# Patient Record
Sex: Male | Born: 1965 | Race: White | Hispanic: No | Marital: Married | Smoking: Never smoker
Health system: Southern US, Community
[De-identification: ages and names within clinical notes are randomized; demographics above are authoritative.]

## PROBLEM LIST (undated history)

## (undated) HISTORY — PX: BRAIN SURGERY: SHX531

---

## 2017-01-15 ENCOUNTER — Emergency Department (HOSPITAL_BASED_OUTPATIENT_CLINIC_OR_DEPARTMENT_OTHER)
Admission: EM | Admit: 2017-01-15 | Discharge: 2017-01-16 | Disposition: A | Discharge status: Home / Self Care | Payer: BLUE CROSS/BLUE SHIELD | Attending: Emergency Medicine | Admitting: Emergency Medicine

## 2017-01-15 ENCOUNTER — Emergency Department (HOSPITAL_BASED_OUTPATIENT_CLINIC_OR_DEPARTMENT_OTHER): Payer: BLUE CROSS/BLUE SHIELD

## 2017-01-15 ENCOUNTER — Encounter (HOSPITAL_BASED_OUTPATIENT_CLINIC_OR_DEPARTMENT_OTHER): Payer: Self-pay | Admitting: *Deleted

## 2017-01-15 DIAGNOSIS — Y999 Unspecified external cause status: Secondary | ICD-10-CM | POA: Diagnosis not present

## 2017-01-15 DIAGNOSIS — Y929 Unspecified place or not applicable: Secondary | ICD-10-CM | POA: Insufficient documentation

## 2017-01-15 DIAGNOSIS — S59902A Unspecified injury of left elbow, initial encounter: Secondary | ICD-10-CM | POA: Diagnosis present

## 2017-01-15 DIAGNOSIS — S8002XA Contusion of left knee, initial encounter: Secondary | ICD-10-CM | POA: Diagnosis not present

## 2017-01-15 DIAGNOSIS — S51012A Laceration without foreign body of left elbow, initial encounter: Secondary | ICD-10-CM | POA: Insufficient documentation

## 2017-01-15 DIAGNOSIS — S63501A Unspecified sprain of right wrist, initial encounter: Secondary | ICD-10-CM

## 2017-01-15 DIAGNOSIS — Y9389 Activity, other specified: Secondary | ICD-10-CM | POA: Diagnosis not present

## 2017-01-15 DIAGNOSIS — S0121XA Laceration without foreign body of nose, initial encounter: Secondary | ICD-10-CM | POA: Diagnosis not present

## 2017-01-15 DIAGNOSIS — S6391XA Sprain of unspecified part of right wrist and hand, initial encounter: Secondary | ICD-10-CM | POA: Diagnosis not present

## 2017-01-15 DIAGNOSIS — S0181XA Laceration without foreign body of other part of head, initial encounter: Secondary | ICD-10-CM

## 2017-01-15 DIAGNOSIS — Z23 Encounter for immunization: Secondary | ICD-10-CM | POA: Diagnosis not present

## 2017-01-15 MED ORDER — LIDOCAINE HCL 2 % IJ SOLN
20.0000 mL | Freq: Once | INTRAMUSCULAR | Status: AC
  Administered 2017-01-15: 400 mg
  Filled 2017-01-15: qty 20

## 2017-01-15 MED ORDER — TETANUS-DIPHTH-ACELL PERTUSSIS 5-2.5-18.5 LF-MCG/0.5 IM SUSP
0.5000 mL | Freq: Once | INTRAMUSCULAR | Status: AC
  Administered 2017-01-15: 0.5 mL via INTRAMUSCULAR
  Filled 2017-01-15: qty 0.5

## 2017-01-15 NOTE — ED Provider Notes (Signed)
[facility] Provider Note   CSN: [id number] Arrival date & time: 01/15/17  2231  By signing my name below, I, Diona Browner, attest that this documentation has been prepared under the direction and in the presence of Kailon Treese, Canary Brim, MD. Electronically Signed: Diona Browner, ED Scribe. 01/15/17. 10:56 PM.  History   Chief Complaint Chief Complaint  Patient presents with  . Fall    HPI Carlos Callahan is a 51 y.o. male who presents to the Emergency Department complaining of multiple lacerations s/p falling off of a horse ~ 2 hours ago. Associates sx include wrist pain, left knee pain, left shoulder pain and wounds to his left elbow and nose. Tetanus UTD. Pt denies LOC, HA, and head injury.  The history is provided by the patient. No language interpreter was used.    History reviewed. No pertinent past medical history.  There are no active problems to display for this patient.   Past Surgical History:  Procedure Laterality Date  . BRAIN SURGERY         Home Medications    Prior to Admission medications   Medication Sig Start Date End Date Taking? Authorizing Provider  oxyCODONE-acetaminophen (PERCOCET) 5-325 MG tablet Take 1-2 tablets by mouth every 4 (four) hours as needed. 01/16/17   Gilda Crease, MD    Family History No family history on file.  Social History Social History  Substance Use Topics  . Smoking status: Never Smoker  . Smokeless tobacco: Not on file  . Alcohol use 3.6 oz/week    6 Cans of beer per week     Allergies   Patient has no known allergies.   Review of Systems Review of Systems  Musculoskeletal: Positive for arthralgias, joint swelling and myalgias.  Skin: Positive for wound.  Neurological: Negative for syncope and headaches.  All other systems reviewed and are negative.    Physical Exam Updated Vital Signs BP (!) 162/99   Pulse 80   Temp 98.4 F (36.9 C) (Oral)   Resp 16   Ht 6\' 1"  (1.854 m)    Wt 102.1 kg (225 lb)   SpO2 100%   BMI 29.69 kg/m   Physical Exam  Constitutional: He is oriented to person, place, and time. He appears well-developed and well-nourished. No distress.  HENT:  Head: Normocephalic and atraumatic.  Right Ear: Hearing normal.  Left Ear: Hearing normal.  Nose: Nose normal.  Mouth/Throat: Oropharynx is clear and moist and mucous membranes are normal.  Eyes: Conjunctivae and EOM are normal. Pupils are equal, round, and reactive to light.  Neck: Normal range of motion. Neck supple.  Cardiovascular: Regular rhythm, S1 normal and S2 normal.  Exam reveals no gallop and no friction rub.   No murmur heard. Pulmonary/Chest: Effort normal and breath sounds normal. No respiratory distress. He exhibits no tenderness.  Abdominal: Soft. Normal appearance and bowel sounds are normal. There is no hepatosplenomegaly. There is no tenderness. There is no rebound, no guarding, no tenderness at McBurney's point and negative Murphy's sign. No hernia.  Musculoskeletal: Normal range of motion.  Swelling and abrasion to the bola of the right wrist. Bruising at the lower aspect of right thumb. Left knee anterior swelling. Normal ROM. Mild abrasion of right forehead. 1.5 cm laceration over the nose. No septal hematoma of the nose. abrasion and 2 cm laceration to left elbow.  Neurological: He is alert and oriented to person, place, and time. He has normal strength. No cranial nerve deficit or sensory  deficit. Coordination normal. GCS eye subscore is 4. GCS verbal subscore is 5. GCS motor subscore is 6.  Skin: Skin is warm, dry and intact. No rash noted. No cyanosis.  Psychiatric: He has a normal mood and affect. His speech is normal and behavior is normal. Thought content normal.  Nursing note and vitals reviewed.    ED Treatments / Results  DIAGNOSTIC STUDIES: Oxygen Saturation is 100% on RA, normal by my interpretation.   COORDINATION OF CARE: 10:56 PM-Discussed next steps  with pt. Pt verbalized understanding and is agreeable with the plan.   Labs (all labs ordered are listed, but only abnormal results are displayed) Labs Reviewed - No data to display  EKG  EKG Interpretation None       Radiology Dg Nasal Bones  Result Date: 01/16/2017 CLINICAL DATA:  Patient fell from horse. Laceration to the mid nose. EXAM: NASAL BONES - 3+ VIEW COMPARISON:  None. FINDINGS: There is no evidence of fracture or other bone abnormality. IMPRESSION: Negative. Electronically Signed   By: Tollie Eth M.D.   On: 01/16/2017 00:12   Dg Elbow Complete Left  Result Date: 01/16/2017 CLINICAL DATA:  Larey Seat from horse today. Open wound of the posterior left elbow. EXAM: LEFT ELBOW - COMPLETE 3+ VIEW COMPARISON:  None. FINDINGS: There is no evidence of fracture, dislocation, or joint effusion. Soft tissue laceration posterior to the olecranon with subcutaneous air and soft tissue debris noted. IMPRESSION: Negative for acute elbow fracture or dislocation. No joint effusion. Soft tissue laceration overlying the olecranon with soft tissue debris noted. Electronically Signed   By: Tollie Eth M.D.   On: 01/16/2017 00:05   Dg Wrist Complete Right  Result Date: 01/16/2017 CLINICAL DATA:  Right wrist pain after fall from horse. EXAM: RIGHT WRIST - COMPLETE 3+ VIEW COMPARISON:  None. FINDINGS: There is no evidence of fracture or dislocation. Slight joint space narrowing at the base of the thumb metacarpal with spurring. Soft tissue swelling is noted about the wrist. IMPRESSION: First CMC joint osteoarthritis. No acute osseous abnormality. Soft tissue swelling about the wrist. Electronically Signed   By: Tollie Eth M.D.   On: 01/16/2017 00:11   Dg Knee Complete 4 Views Left  Result Date: 01/16/2017 CLINICAL DATA:  Patient fell from horse EXAM: LEFT KNEE - COMPLETE 4+ VIEW COMPARISON:  None. FINDINGS: No evidence of fracture, dislocation, or joint effusion. Stigmata of old MCL injury with  curvilinear soft tissue calcification adjacent to the left medial femoral condyle. No evidence of arthropathy or other focal bone abnormality. Soft tissues are unremarkable. IMPRESSION: 1. 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 2. No acute osseous abnormality. Electronically Signed   By: Tollie Eth M.D.   On: 01/16/2017 00:09    Procedures .Marland KitchenLaceration Repair Date/Time: 01/16/2017 12:54 AM Performed by: Gilda Crease Authorized by: Gilda Crease   Consent:    Consent obtained:  Verbal   Consent given by:  Patient   Risks discussed:  Infection and retained foreign body Universal protocol:    Procedure explained and questions answered to patient or proxy's satisfaction: yes     Relevant documents present and verified: yes     Test results available and properly labeled: yes     Imaging studies available: yes     Required blood products, implants, devices, and special equipment available: yes     Site/side marked: yes     Immediately prior to  procedure, a time out was called: yes     Patient identity confirmed:  Verbally with patient Anesthesia (see MAR for exact dosages):    Anesthesia method:  Local infiltration   Local anesthetic:  Lidocaine 2% w/o epi Laceration details:    Location:  Shoulder/arm   Shoulder/arm location:  L elbow   Length (cm):  2.5   Depth (mm):  5 Repair type:    Repair type:  Simple Pre-procedure details:    Preparation:  Patient was prepped and draped in usual sterile fashion Exploration:    Wound exploration: wound explored through full range of motion     Contaminated: yes (slightly, small sand like debris)   Treatment:    Area cleansed with:  Betadine   Amount of cleaning:  Extensive   Irrigation solution:  Sterile saline   Irrigation volume:  1000   Irrigation method:  Syringe   Visualized foreign bodies/material removed:  yes   Skin repair:    Repair method:  Sutures   Suture size:  4-0   Suture material:  Nylon   Suture technique:  Simple interrupted   Number of sutures:  3 Approximation:    Approximation:  Close   Vermilion border: well-aligned   Post-procedure details:    Patient tolerance of procedure:  Tolerated well, no immediate complications .Marland Kitchen.Laceration Repair Date/Time: 01/16/2017 12:56 AM Performed by: Gilda CreasePOLLINA, Carlos Zanetti J Authorized by: Gilda CreasePOLLINA, Sondi Desch J   Consent:    Consent obtained:  Verbal   Consent given by:  Patient   Risks discussed:  Infection and poor cosmetic result Universal protocol:    Procedure explained and questions answered to patient or proxy's satisfaction: yes     Relevant documents present and verified: yes     Test results available and properly labeled: yes     Imaging studies available: yes     Required blood products, implants, devices, and special equipment available: yes     Site/side marked: yes     Immediately prior to procedure, a time out was called: yes     Patient identity confirmed:  Verbally with patient Anesthesia (see MAR for exact dosages):    Anesthesia method:  None Laceration details:    Location:  Face   Face location:  Nose   Length (cm):  1.5 Repair type:    Repair type:  Simple Treatment:    Area cleansed with:  Betadine   Amount of cleaning:  Standard   Irrigation solution:  Sterile saline   Irrigation method:  Syringe Skin repair:    Repair method:  Tissue adhesive Approximation:    Approximation:  Close   Vermilion border: well-aligned   Post-procedure details:    Patient tolerance of procedure:  Tolerated well, no immediate complications   (including critical care time)  Medications Ordered in ED Medications  lidocaine (XYLOCAINE) 2 % (with pres) injection 400 mg (400 mg Infiltration Given 01/15/17 2309)  Tdap (BOOSTRIX) injection 0.5 mL (0.5 mLs Intramuscular Given 01/15/17 2307)     Initial Impression /  Assessment and Plan / ED Course  I have reviewed the triage vital signs and the nursing notes.  Pertinent labs & imaging results that were available during my care of the patient were reviewed by me and considered in my medical decision making (see chart for details).     Patient presents with complaints of injuries from falling off a horse. Patient complaining of facial laceration, right wrist pain, left elbow laceration and pain, left knee pain. There  was no deformities noted. X-ray of nasal bones, right wrist, left elbow, left knee were unremarkable. He did not lose consciousness, normal neurologic exam. He is not expressing headache, neck pain or back pain.  Left elbow was slightly contaminated. It was irrigated extensively and repaired. I did recommend sutures for cosmetic outcome on his nose, but he did not wish to have sutures placed in the nose. He opted for skin glue, is aware that this may fail and cause increased scarring.  Final Clinical Impressions(s) / ED Diagnoses   Final diagnoses:  Elbow laceration, left, initial encounter  Facial laceration, initial encounter  Wrist sprain, right, initial encounter  Contusion of left knee, initial encounter    New Prescriptions New Prescriptions   OXYCODONE-ACETAMINOPHEN (PERCOCET) 5-325 MG TABLET    Take 1-2 tablets by mouth every 4 (four) hours as needed.   I personally performed the services described in this documentation, which was scribed in my presence. The recorded information has been reviewed and is accurate.     Gilda Crease, MD 01/16/17 0100

## 2017-01-15 NOTE — ED Triage Notes (Signed)
Pt c/o fall from from horse x 2 hrs ago lac to left elbow ,lac to nose, wrist pain , left knee pain , left shoulder pain

## 2017-01-16 MED ORDER — OXYCODONE-ACETAMINOPHEN 5-325 MG PO TABS: 1.0000 | ORAL_TABLET | ORAL | 0 refills | Status: AC | PRN

## 2018-05-06 IMAGING — CR DG ELBOW COMPLETE 3+V*L*
4 series · 4 of 4 positions shown · non-contrast
Comparison: None.

CLINICAL DATA: Fell from horse today. Open wound of the posterior
left elbow.

EXAM:
LEFT ELBOW - COMPLETE 3+ VIEW

[x elbow joint ap left]
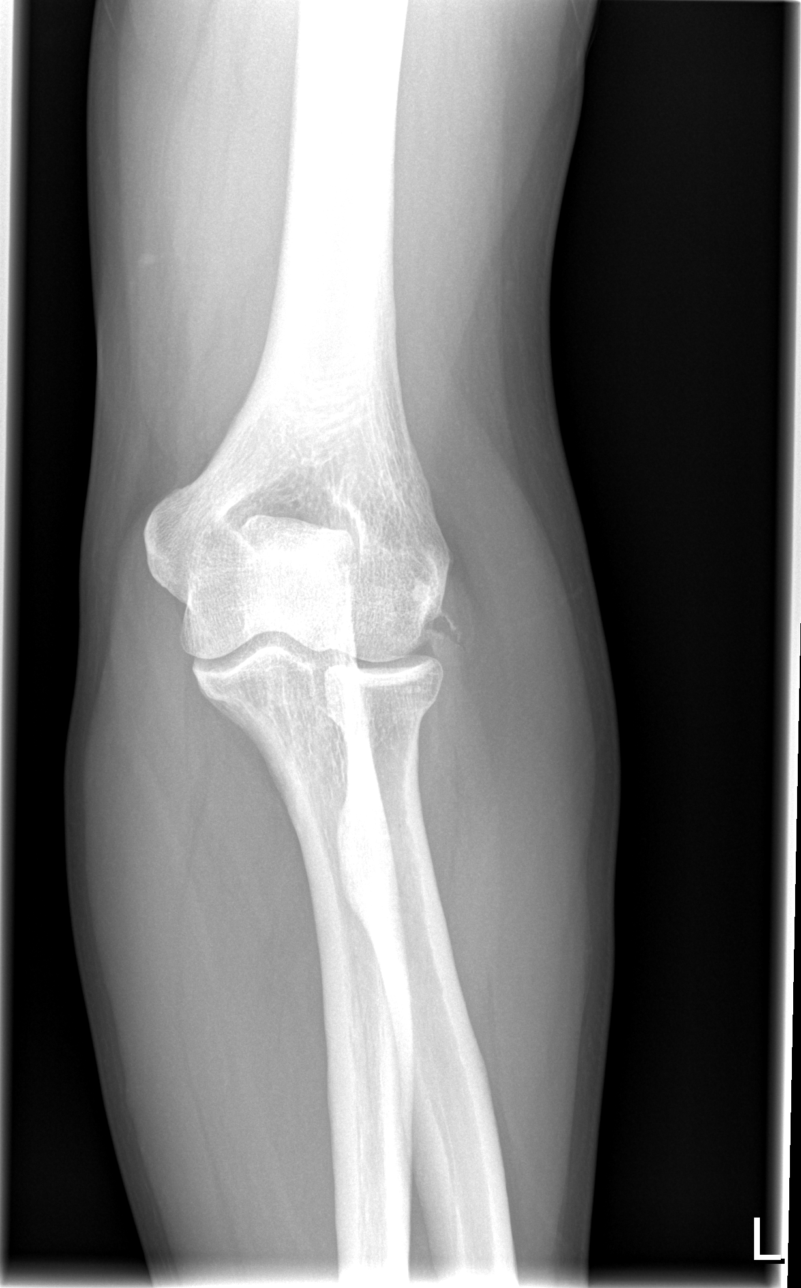

[x elbow joint obl. left (1 of 2)]
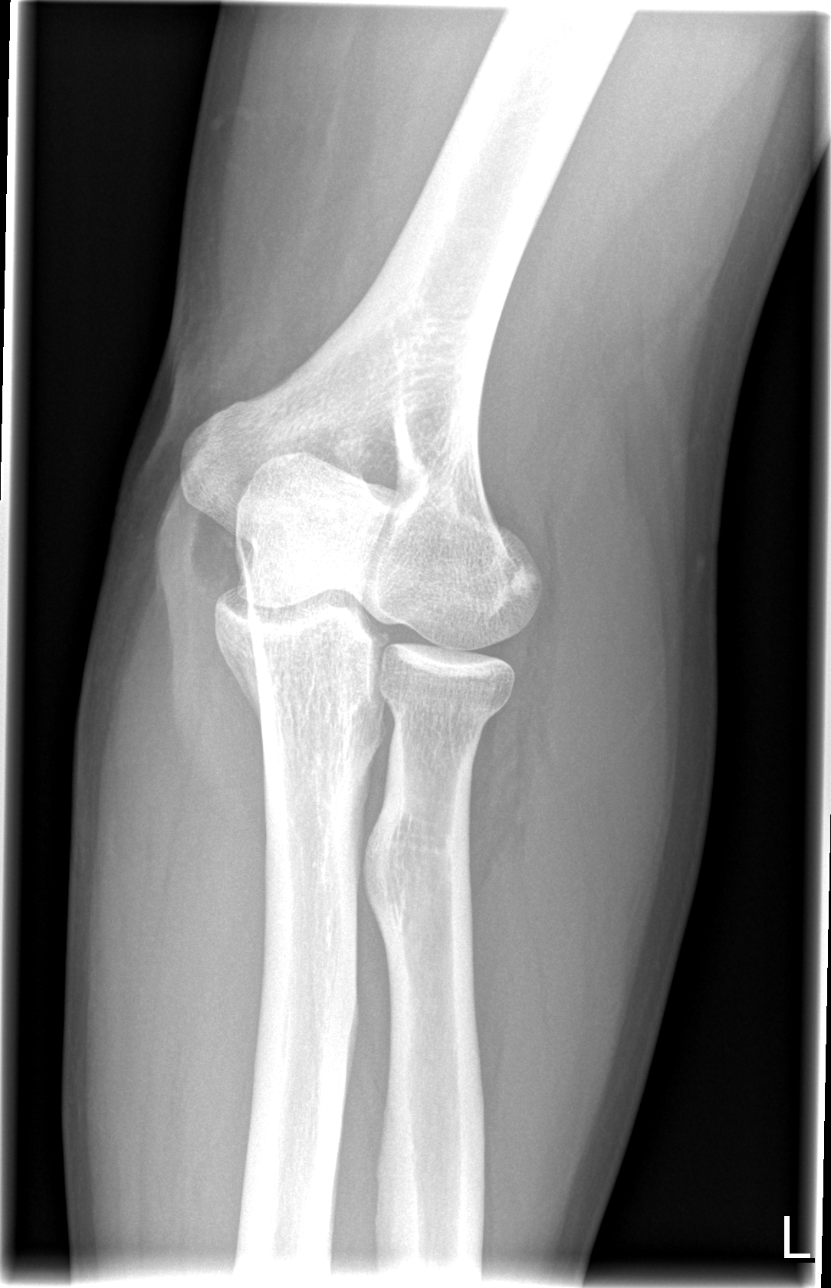

[x elbow joint obl. left (2 of 2)]
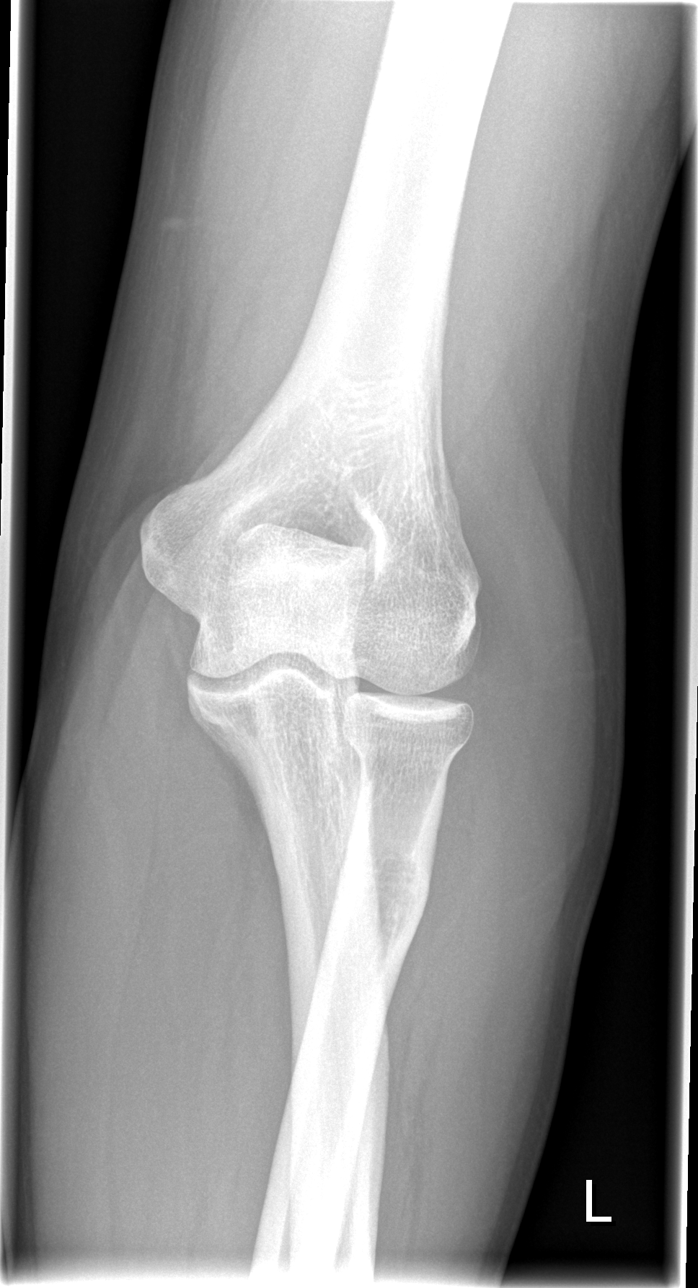

[x elbow joint lat left]
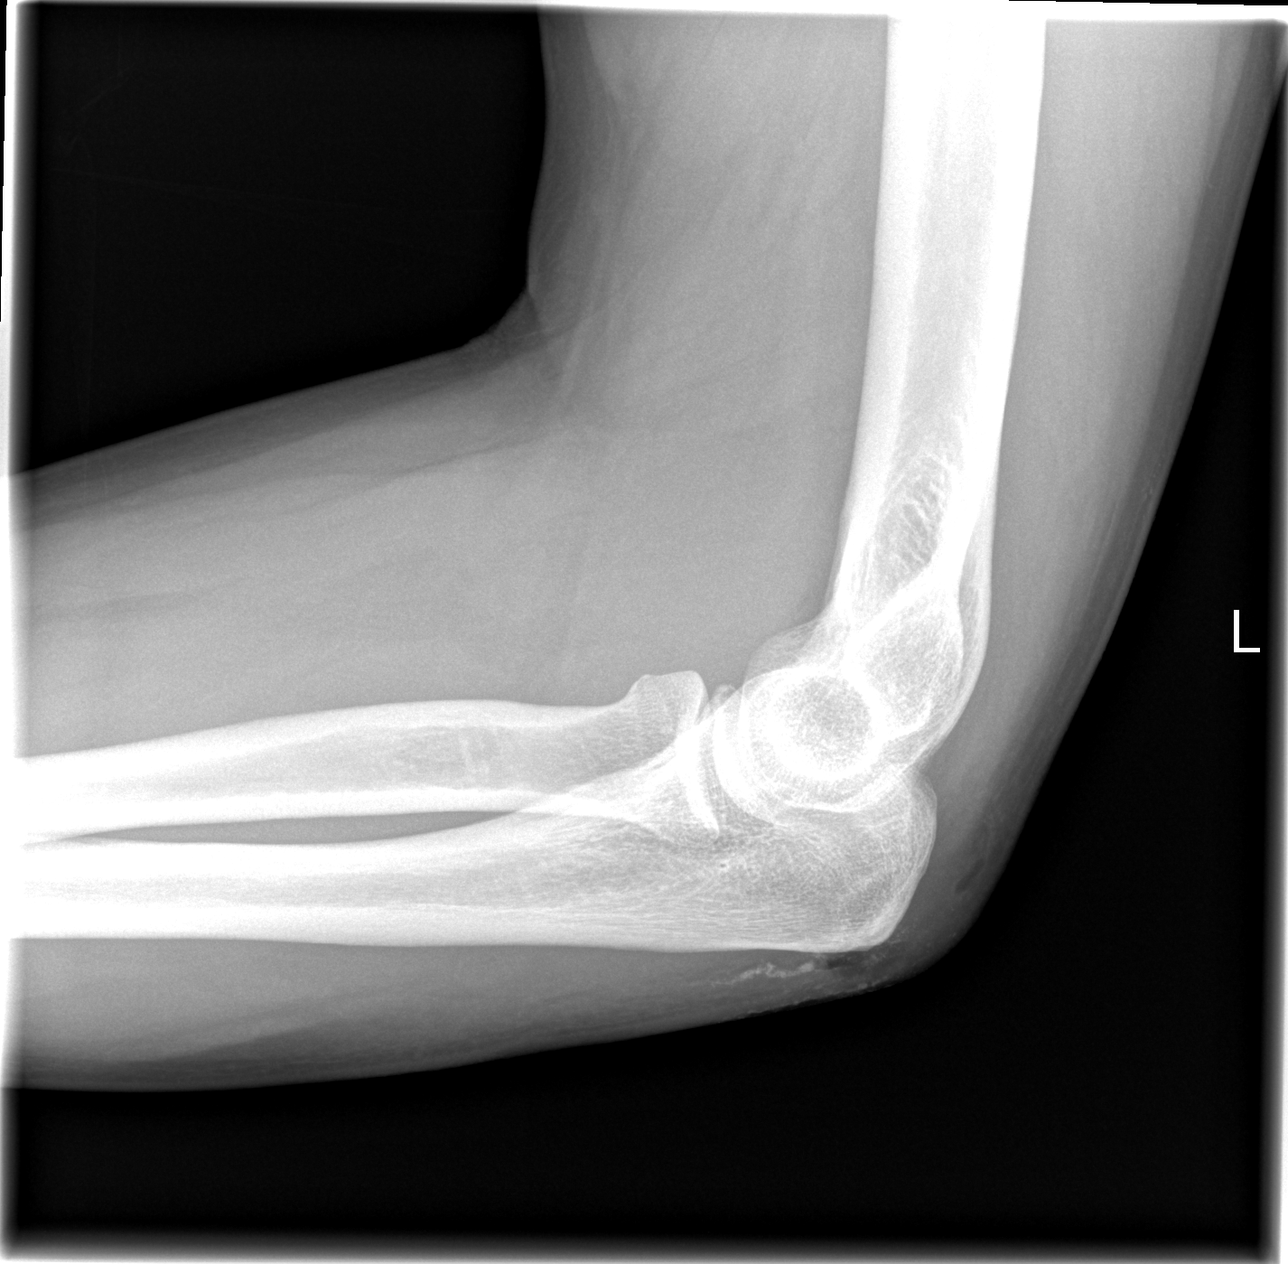

[4 of 4 positions shown; findings below may reference images not displayed]

FINDINGS: There is no evidence of fracture, dislocation, or joint effusion.
Soft tissue laceration posterior to the olecranon with subcutaneous
air and soft tissue debris noted.
IMPRESSION: Negative for acute elbow fracture or dislocation. No joint effusion.
Soft tissue laceration overlying the olecranon with soft tissue
debris noted.

## 2018-05-06 IMAGING — CR DG WRIST COMPLETE 3+V*R*
4 series · 4 of 4 positions shown · non-contrast
Comparison: None.

CLINICAL DATA: Right wrist pain after fall from horse.

EXAM:
RIGHT WRIST - COMPLETE 3+ VIEW

[x wrist pa right]
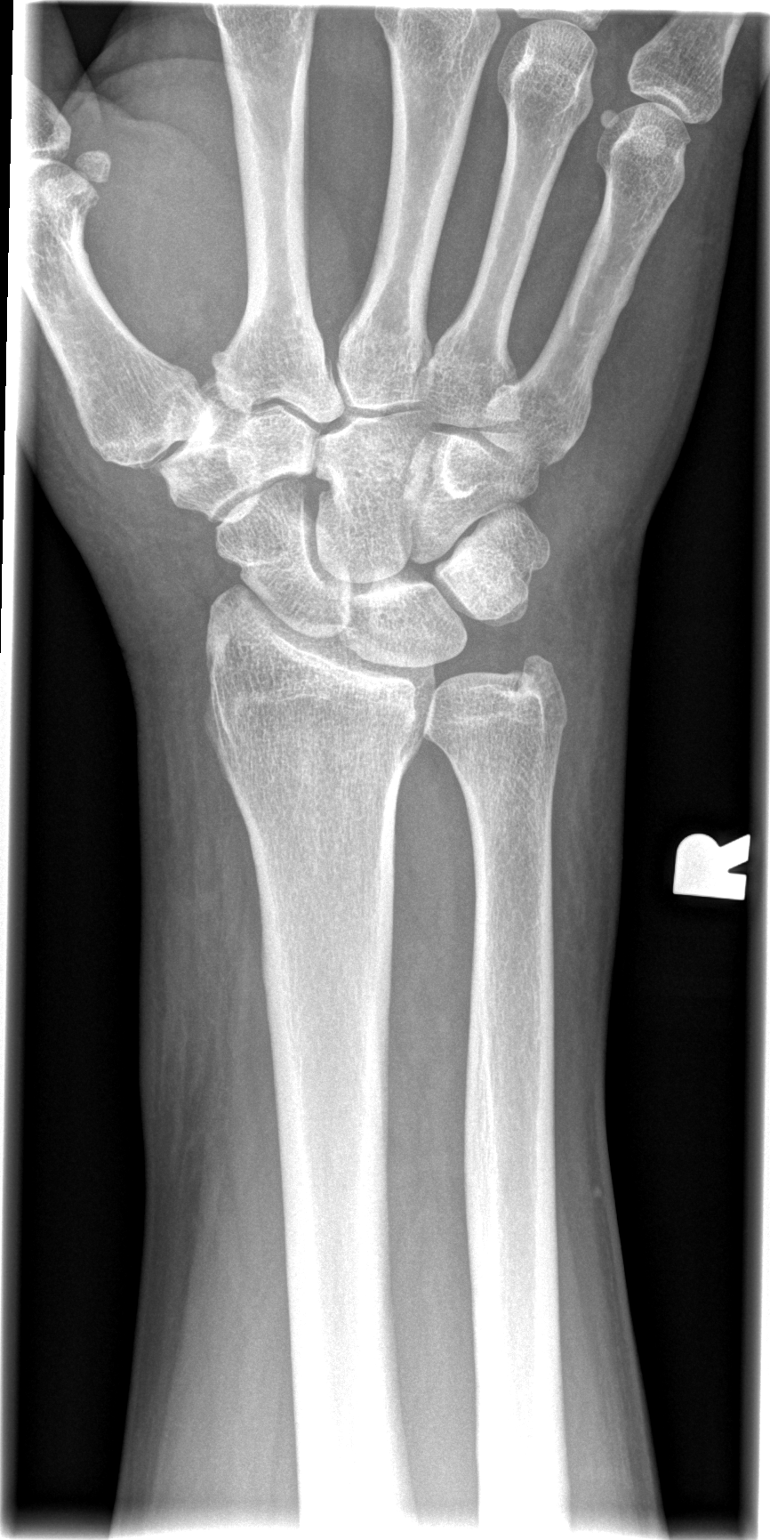

[x wrist obl right]
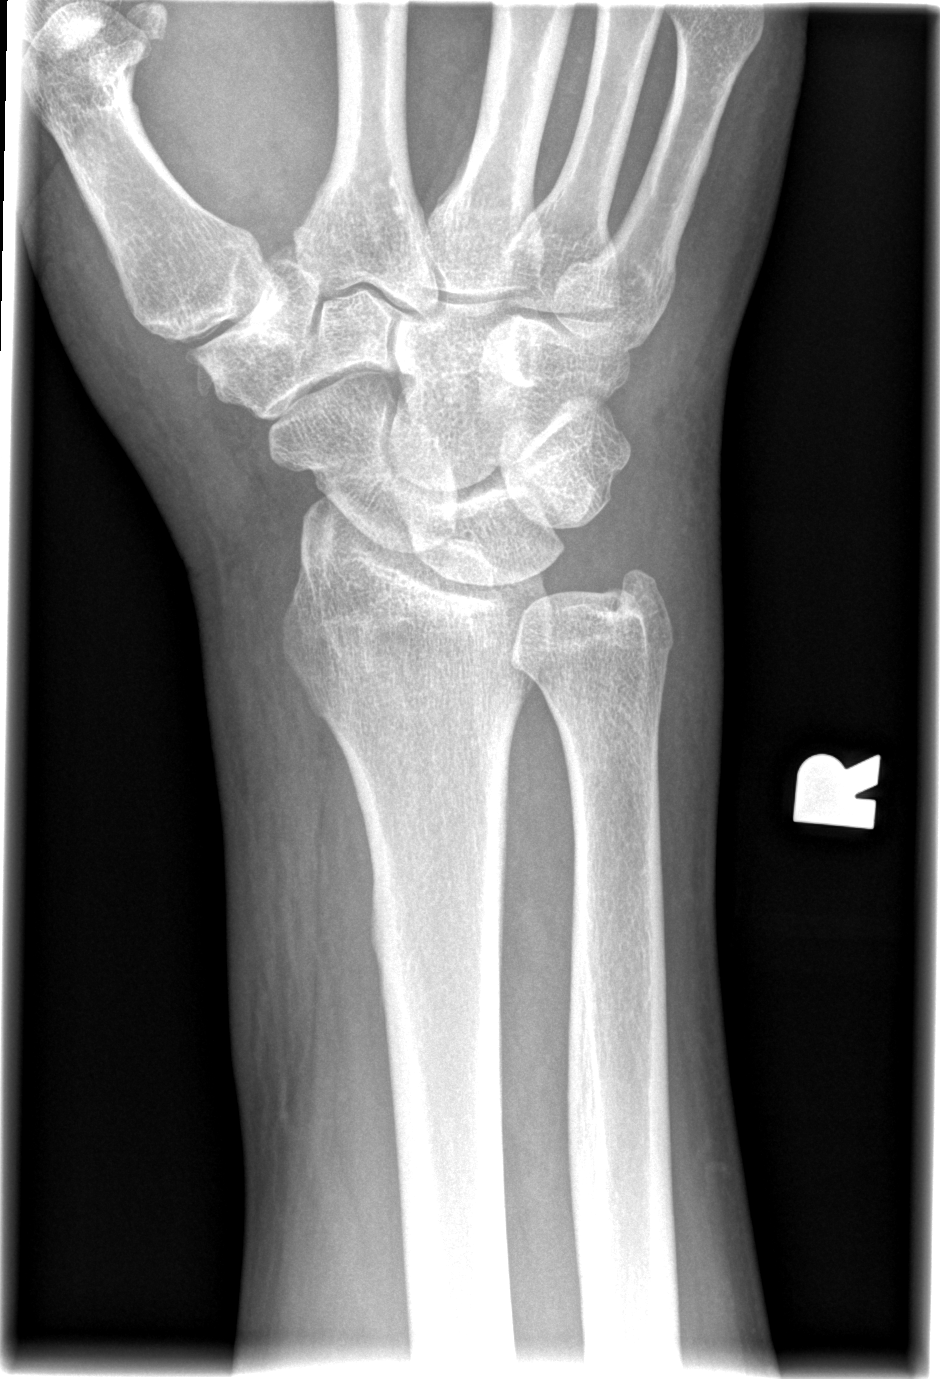

[x wrist lat right]
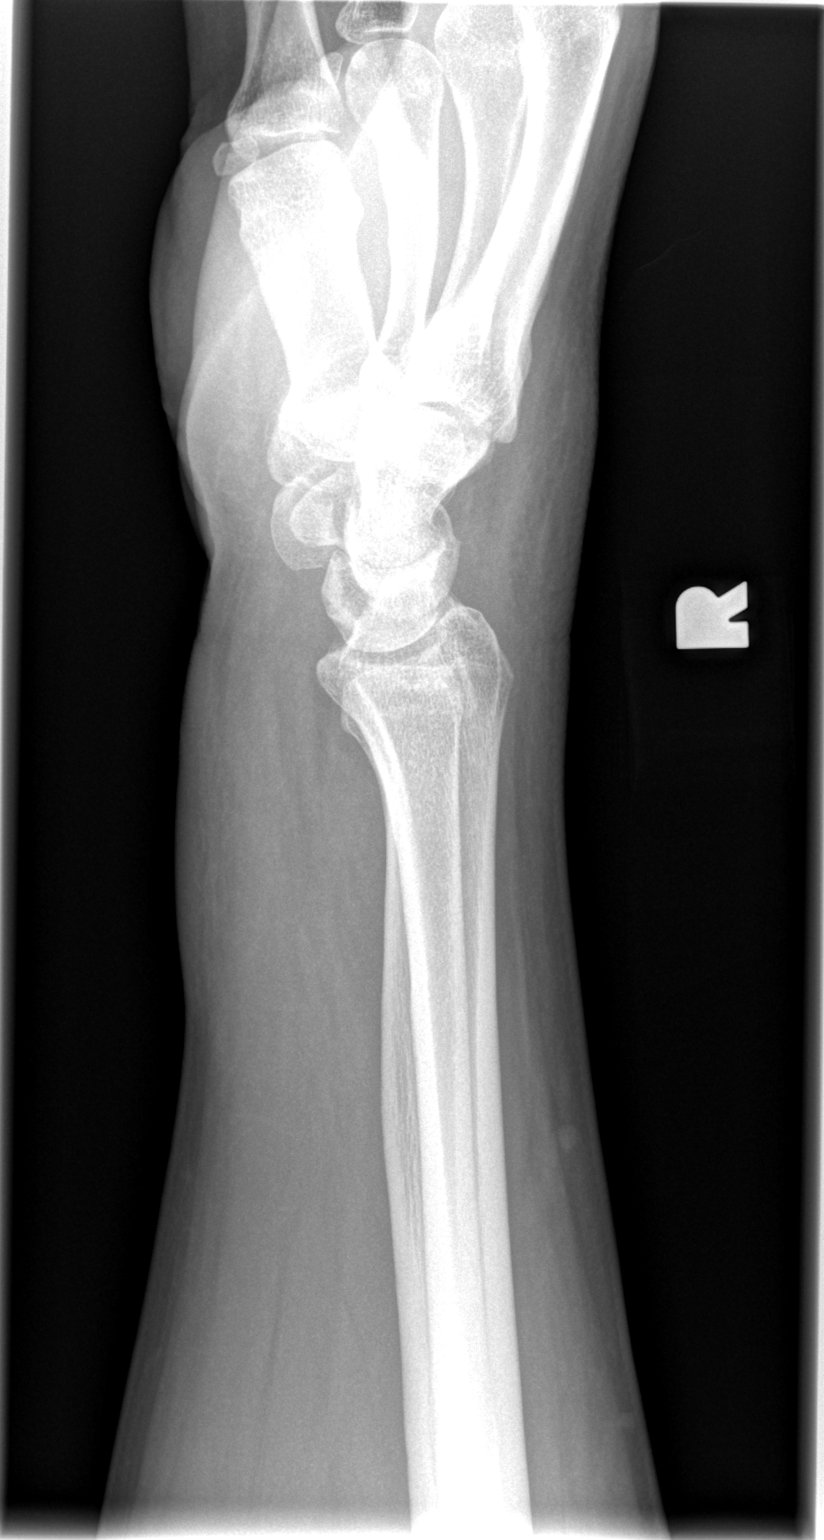

[x navicular]
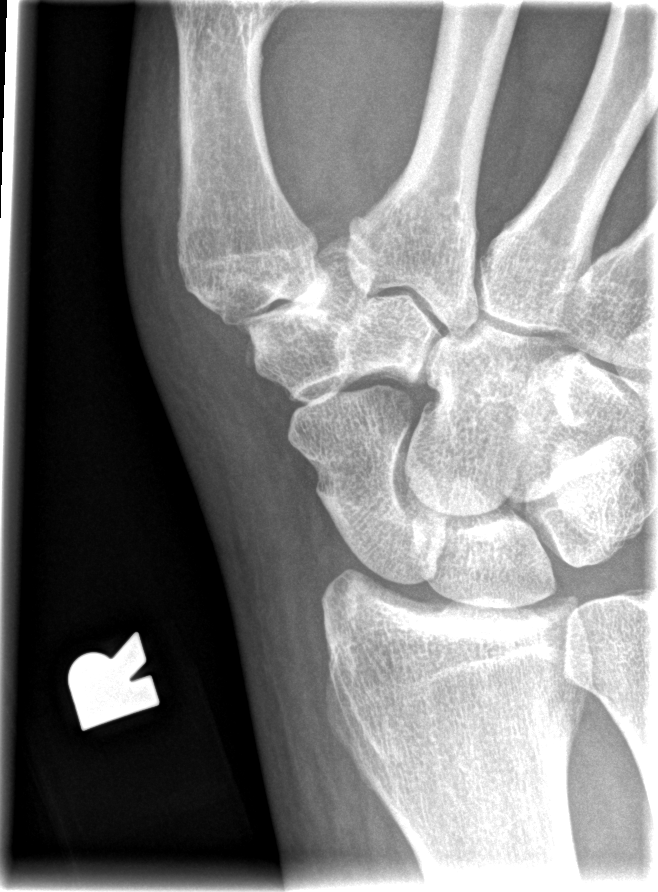

[4 of 4 positions shown; findings below may reference images not displayed]

FINDINGS: There is no evidence of fracture or dislocation. Slight joint space
narrowing at the base of the thumb metacarpal with spurring. Soft
tissue swelling is noted about the wrist.
IMPRESSION: First CMC joint osteoarthritis. No acute osseous abnormality. Soft
tissue swelling about the wrist.
